# Patient Record
Sex: Female | Born: 1980 | Race: White | Hispanic: No | Marital: Married | Smoking: Current every day smoker
Health system: Southern US, Community
[De-identification: ages and names within clinical notes are randomized; demographics above are authoritative.]

## PROBLEM LIST (undated history)

## (undated) ENCOUNTER — Inpatient Hospital Stay (HOSPITAL_COMMUNITY): Payer: Self-pay

## (undated) DIAGNOSIS — F419 Anxiety disorder, unspecified: Secondary | ICD-10-CM

## (undated) DIAGNOSIS — Z87898 Personal history of other specified conditions: Secondary | ICD-10-CM

## (undated) DIAGNOSIS — F32A Depression, unspecified: Secondary | ICD-10-CM

## (undated) DIAGNOSIS — F329 Major depressive disorder, single episode, unspecified: Secondary | ICD-10-CM

## (undated) DIAGNOSIS — A609 Anogenital herpesviral infection, unspecified: Secondary | ICD-10-CM

## (undated) HISTORY — PX: SHOULDER SURGERY: SHX246

## (undated) HISTORY — DX: Personal history of other specified conditions: Z87.898

## (undated) HISTORY — DX: Anogenital herpesviral infection, unspecified: A60.9

---

## 2004-03-05 ENCOUNTER — Inpatient Hospital Stay (HOSPITAL_COMMUNITY): Admission: AD | Admit: 2004-03-05 | Discharge: 2004-03-07 | Payer: Self-pay | Admitting: Obstetrics and Gynecology

## 2004-08-08 ENCOUNTER — Other Ambulatory Visit: Admission: RE | Admit: 2004-08-08 | Discharge: 2004-08-08 | Payer: Self-pay | Admitting: Obstetrics and Gynecology

## 2005-09-05 ENCOUNTER — Other Ambulatory Visit: Admission: RE | Admit: 2005-09-05 | Discharge: 2005-09-05 | Payer: Self-pay | Admitting: Obstetrics and Gynecology

## 2008-07-07 ENCOUNTER — Ambulatory Visit (HOSPITAL_COMMUNITY): Admission: RE | Admit: 2008-07-07 | Discharge: 2008-07-07 | Payer: Self-pay | Admitting: Preventative Medicine

## 2013-05-31 ENCOUNTER — Encounter (HOSPITAL_COMMUNITY): Payer: Self-pay

## 2013-05-31 ENCOUNTER — Emergency Department (HOSPITAL_COMMUNITY): Payer: No Typology Code available for payment source

## 2013-05-31 ENCOUNTER — Emergency Department (HOSPITAL_COMMUNITY)
Admission: EM | Admit: 2013-05-31 | Discharge: 2013-05-31 | Disposition: A | Discharge status: Home / Self Care | Payer: No Typology Code available for payment source | Attending: Emergency Medicine | Admitting: Emergency Medicine

## 2013-05-31 DIAGNOSIS — Z8659 Personal history of other mental and behavioral disorders: Secondary | ICD-10-CM | POA: Insufficient documentation

## 2013-05-31 DIAGNOSIS — F17200 Nicotine dependence, unspecified, uncomplicated: Secondary | ICD-10-CM | POA: Insufficient documentation

## 2013-05-31 DIAGNOSIS — M545 Low back pain: Secondary | ICD-10-CM

## 2013-05-31 DIAGNOSIS — R51 Headache: Secondary | ICD-10-CM | POA: Insufficient documentation

## 2013-05-31 DIAGNOSIS — IMO0002 Reserved for concepts with insufficient information to code with codable children: Secondary | ICD-10-CM | POA: Insufficient documentation

## 2013-05-31 DIAGNOSIS — Y92410 Unspecified street and highway as the place of occurrence of the external cause: Secondary | ICD-10-CM | POA: Insufficient documentation

## 2013-05-31 DIAGNOSIS — Y9389 Activity, other specified: Secondary | ICD-10-CM | POA: Insufficient documentation

## 2013-05-31 HISTORY — DX: Major depressive disorder, single episode, unspecified: F32.9

## 2013-05-31 HISTORY — DX: Depression, unspecified: F32.A

## 2013-05-31 HISTORY — DX: Anxiety disorder, unspecified: F41.9

## 2013-05-31 MED ORDER — OXYCODONE-ACETAMINOPHEN 5-325 MG PO TABS
1.0000 | ORAL_TABLET | Freq: Once | ORAL | Status: AC
  Administered 2013-05-31: 1 via ORAL
  Filled 2013-05-31: qty 1

## 2013-05-31 MED ORDER — MORPHINE SULFATE 4 MG/ML IJ SOLN: 4.0000 mg | Freq: Once | INTRAMUSCULAR | Status: DC

## 2013-05-31 MED ORDER — DIAZEPAM 5 MG PO TABS
5.0000 mg | ORAL_TABLET | Freq: Once | ORAL | Status: AC
  Administered 2013-05-31: 5 mg via ORAL
  Filled 2013-05-31: qty 1

## 2013-05-31 MED ORDER — HYDROCODONE-ACETAMINOPHEN 5-325 MG PO TABS: 1.0000 | ORAL_TABLET | Freq: Four times a day (QID) | ORAL | Status: DC | PRN

## 2013-05-31 MED ORDER — NAPROXEN 500 MG PO TABS: 500.0000 mg | ORAL_TABLET | Freq: Two times a day (BID) | ORAL | Status: DC

## 2013-05-31 MED ORDER — DIAZEPAM 5 MG PO TABS: 5.0000 mg | ORAL_TABLET | Freq: Three times a day (TID) | ORAL | Status: DC | PRN
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
## 2013-06-06 ENCOUNTER — Emergency Department (HOSPITAL_COMMUNITY)
Admission: EM | Admit: 2013-06-06 | Discharge: 2013-06-06 | Disposition: A | Discharge status: Home / Self Care | Payer: No Typology Code available for payment source | Attending: Emergency Medicine | Admitting: Emergency Medicine

## 2013-06-06 ENCOUNTER — Emergency Department (HOSPITAL_COMMUNITY): Payer: No Typology Code available for payment source

## 2013-06-06 ENCOUNTER — Encounter (HOSPITAL_COMMUNITY): Payer: Self-pay | Admitting: Emergency Medicine

## 2013-06-06 DIAGNOSIS — Z79899 Other long term (current) drug therapy: Secondary | ICD-10-CM | POA: Insufficient documentation

## 2013-06-06 DIAGNOSIS — F3289 Other specified depressive episodes: Secondary | ICD-10-CM | POA: Insufficient documentation

## 2013-06-06 DIAGNOSIS — R51 Headache: Secondary | ICD-10-CM | POA: Insufficient documentation

## 2013-06-06 DIAGNOSIS — M542 Cervicalgia: Secondary | ICD-10-CM | POA: Insufficient documentation

## 2013-06-06 DIAGNOSIS — F329 Major depressive disorder, single episode, unspecified: Secondary | ICD-10-CM | POA: Insufficient documentation

## 2013-06-06 DIAGNOSIS — M549 Dorsalgia, unspecified: Secondary | ICD-10-CM | POA: Insufficient documentation

## 2013-06-06 DIAGNOSIS — R209 Unspecified disturbances of skin sensation: Secondary | ICD-10-CM | POA: Insufficient documentation

## 2013-06-06 DIAGNOSIS — F411 Generalized anxiety disorder: Secondary | ICD-10-CM | POA: Insufficient documentation

## 2013-06-06 DIAGNOSIS — F29 Unspecified psychosis not due to a substance or known physiological condition: Secondary | ICD-10-CM | POA: Insufficient documentation

## 2013-06-06 DIAGNOSIS — F17200 Nicotine dependence, unspecified, uncomplicated: Secondary | ICD-10-CM | POA: Insufficient documentation

## 2013-06-06 DIAGNOSIS — G8911 Acute pain due to trauma: Secondary | ICD-10-CM | POA: Insufficient documentation

## 2013-06-06 MED ORDER — DEXAMETHASONE SODIUM PHOSPHATE 10 MG/ML IJ SOLN
10.0000 mg | Freq: Once | INTRAMUSCULAR | Status: AC
  Administered 2013-06-06: 10 mg via INTRAMUSCULAR
  Filled 2013-06-06: qty 1

## 2013-06-06 MED ORDER — CYCLOBENZAPRINE HCL 5 MG PO TABS: 5.0000 mg | ORAL_TABLET | Freq: Three times a day (TID) | ORAL | Status: DC | PRN

## 2013-06-06 MED ORDER — TRAMADOL HCL 50 MG PO TABS: 50.0000 mg | ORAL_TABLET | Freq: Four times a day (QID) | ORAL | Status: DC | PRN

## 2013-06-06 MED ORDER — KETOROLAC TROMETHAMINE 30 MG/ML IJ SOLN
30.0000 mg | Freq: Once | INTRAMUSCULAR | Status: AC
  Administered 2013-06-06: 30 mg via INTRAMUSCULAR
  Filled 2013-06-06: qty 1
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
## 2013-06-06 NOTE — ED Notes (Signed)
While checking vital signs, patient tells me "I have been forgetting things a lot lately." RN Stacy aware.
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
## 2013-06-06 NOTE — Progress Notes (Signed)
[facility] provided pt with a [facility] Orange Card application and a list of primary care resources.  °

## 2013-06-06 NOTE — ED Provider Notes (Signed)
Medical screening examination/treatment/procedure(s) were performed by non-physician practitioner and as supervising physician I was immediately available for consultation/collaboration.    Shanna Cisco, MD 06/06/13 [phone]
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
## 2014-09-04 NOTE — L&D Delivery Note (Signed)
Delivery Note At 9:02 AM a viable and healthy female was delivered via Vaginal, Spontaneous Delivery (Presentation: Right Occiput ; Anterior  ).  APGAR: 9, 9; weight  Pending.   Placenta status: Intact, Spontaneous.  Cord: 3 vessels with the following complications: None.    Anesthesia: Epidural & lidocaine 1% Episiotomy: None Lacerations: Periurethral;1st degree Suture Repair: vicryl 4-0 Est. Blood Loss (mL):  150cc  Mom to postpartum.  Baby to Couplet care / Skin to Skin.  Karinne Schmader H. 04/01/2015, 9:32 AM

## 2014-09-09 LAB — OB RESULTS CONSOLE HIV ANTIBODY (ROUTINE TESTING): HIV: NONREACTIVE

## 2014-09-09 LAB — OB RESULTS CONSOLE RPR: RPR: NONREACTIVE

## 2014-09-09 LAB — OB RESULTS CONSOLE RUBELLA ANTIBODY, IGM: Rubella: IMMUNE

## 2014-09-09 LAB — OB RESULTS CONSOLE GC/CHLAMYDIA
Chlamydia: NEGATIVE
Gonorrhea: NEGATIVE

## 2014-09-09 LAB — OB RESULTS CONSOLE HEPATITIS B SURFACE ANTIGEN: HEP B S AG: NEGATIVE

## 2014-12-07 ENCOUNTER — Inpatient Hospital Stay (HOSPITAL_COMMUNITY)
Admission: AD | Admit: 2014-12-07 | Discharge: 2014-12-07 | Disposition: A | Discharge status: Home / Self Care | Payer: Medicaid Other | Source: Ambulatory Visit | Attending: Obstetrics | Admitting: Obstetrics

## 2014-12-07 ENCOUNTER — Encounter (HOSPITAL_COMMUNITY): Payer: Self-pay | Admitting: *Deleted

## 2014-12-07 DIAGNOSIS — O99332 Smoking (tobacco) complicating pregnancy, second trimester: Secondary | ICD-10-CM | POA: Insufficient documentation

## 2014-12-07 DIAGNOSIS — Z3A22 22 weeks gestation of pregnancy: Secondary | ICD-10-CM | POA: Insufficient documentation

## 2014-12-07 DIAGNOSIS — F17210 Nicotine dependence, cigarettes, uncomplicated: Secondary | ICD-10-CM | POA: Diagnosis not present

## 2014-12-07 DIAGNOSIS — R1030 Lower abdominal pain, unspecified: Secondary | ICD-10-CM | POA: Diagnosis present

## 2014-12-07 DIAGNOSIS — I959 Hypotension, unspecified: Secondary | ICD-10-CM | POA: Diagnosis not present

## 2014-12-07 DIAGNOSIS — N949 Unspecified condition associated with female genital organs and menstrual cycle: Secondary | ICD-10-CM | POA: Diagnosis not present

## 2014-12-07 DIAGNOSIS — R42 Dizziness and giddiness: Secondary | ICD-10-CM | POA: Insufficient documentation

## 2014-12-07 DIAGNOSIS — R102 Pelvic and perineal pain: Secondary | ICD-10-CM | POA: Diagnosis not present

## 2014-12-07 DIAGNOSIS — O9989 Other specified diseases and conditions complicating pregnancy, childbirth and the puerperium: Secondary | ICD-10-CM | POA: Insufficient documentation

## 2014-12-07 DIAGNOSIS — O2652 Maternal hypotension syndrome, second trimester: Secondary | ICD-10-CM

## 2014-12-07 LAB — URINALYSIS, ROUTINE W REFLEX MICROSCOPIC
BILIRUBIN URINE: NEGATIVE
Glucose, UA: NEGATIVE mg/dL
Ketones, ur: NEGATIVE mg/dL
NITRITE: NEGATIVE
PH: 6.5 (ref 5.0–8.0)
Protein, ur: NEGATIVE mg/dL
SPECIFIC GRAVITY, URINE: 1.01 (ref 1.005–1.030)
UROBILINOGEN UA: 0.2 mg/dL (ref 0.0–1.0)

## 2014-12-07 LAB — URINE MICROSCOPIC-ADD ON

## 2014-12-07 LAB — WET PREP, GENITAL
TRICH WET PREP: NONE SEEN
Yeast Wet Prep HPF POC: NONE SEEN

## 2014-12-07 NOTE — MAU Provider Note (Signed)
History     CSN: [id number]  Arrival date and time: 12/07/14 1234   First Provider Initiated Contact with Patient 12/07/14 1323      No chief complaint on file.  HPI  Jane Richardson is a 34 y.o. G2P1 at 5356w6d who presents to MAU today with 2 complaints.   Firstly, the patient endorses bilateral lower abdominal pain since last night that became worse with ambulation today while at work. She denies vaginal bleeding or LOF. She reports good fetal movement. She states pain is worse when sitting, standing or walking and improves with laying down. She denies N/V/D.   Secondly, she states that she has had 3 dizzy spells over the last 1-2 weeks. She is not currently dizzy. Dizziness occurs with standing and improves with sitting. She denies LOC.   OB History    Gravida Para Term Preterm AB TAB SAB Ectopic Multiple Living   2 1        1       Past Medical History  Diagnosis Date  . Depression   . Anxiety     Past Surgical History  Procedure Laterality Date  . Shoulder surgery      History reviewed. No pertinent family history.  History  Substance Use Topics  . Smoking status: Current Every Day Smoker -- 0.50 packs/day    Types: Cigarettes  . Smokeless tobacco: Never Used  . Alcohol Use: No    Allergies: No Known Allergies  No prescriptions prior to admission    Review of Systems  Constitutional: Negative for fever and malaise/fatigue.  Gastrointestinal: Positive for abdominal pain. Negative for nausea, vomiting, diarrhea and constipation.  Genitourinary: Negative for dysuria, urgency and frequency.       Neg - vaginal bleeding, discharge, LOF  Neurological: Positive for dizziness. Negative for loss of consciousness.   Physical Exam   Blood pressure 108/66, pulse 79, temperature 97.8 F (36.6 C), temperature source Oral, resp. rate 18, height 5\' 4"  (1.626 m), last menstrual period 06/18/2014.  Physical Exam  Constitutional: She is oriented to person, place,  and time. She appears well-developed and well-nourished. No distress.  HENT:  Head: Normocephalic.  Cardiovascular: Normal rate, regular rhythm and normal heart sounds.   Respiratory: Effort normal and breath sounds normal. No respiratory distress.  GI: Soft. She exhibits no distension and no mass. There is no tenderness. There is no rebound and no guarding.  Genitourinary: Uterus is enlarged (appropriate for GA). Uterus is not tender. Cervix exhibits no motion tenderness, no discharge and no friability. No bleeding in the vagina. Vaginal discharge (moderate amount of creamy, white discharge noted) found.  Cervix: closed, thick  Neurological: She is alert and oriented to person, place, and time.  Skin: Skin is warm and dry. No erythema.  Psychiatric: She has a normal mood and affect.   Results for orders placed or performed during the hospital encounter of 12/07/14 (from the past 24 hour(s))  Urinalysis, Routine w reflex microscopic     Status: Abnormal   Collection Time: 12/07/14 12:39 PM  Result Value Ref Range   Color, Urine YELLOW YELLOW   APPearance CLEAR CLEAR   Specific Gravity, Urine 1.010 1.005 - 1.030   pH 6.5 5.0 - 8.0   Glucose, UA NEGATIVE NEGATIVE mg/dL   Hgb urine dipstick LARGE (A) NEGATIVE   Bilirubin Urine NEGATIVE NEGATIVE   Ketones, ur NEGATIVE NEGATIVE mg/dL   Protein, ur NEGATIVE NEGATIVE mg/dL   Urobilinogen, UA 0.2 0.0 - 1.0 mg/dL  Nitrite NEGATIVE NEGATIVE   Leukocytes, UA SMALL (A) NEGATIVE  Urine microscopic-add on     Status: Abnormal   Collection Time: 12/07/14 12:39 PM  Result Value Ref Range   Squamous Epithelial / LPF FEW (A) RARE   WBC, UA 3-6 <3 WBC/hpf   RBC / HPF 0-2 <3 RBC/hpf   Bacteria, UA FEW (A) RARE  Wet prep, genital     Status: Abnormal   Collection Time: 12/07/14  1:30 PM  Result Value Ref Range   Yeast Wet Prep HPF POC NONE SEEN NONE SEEN   Trich, Wet Prep NONE SEEN NONE SEEN   Clue Cells Wet Prep HPF POC FEW (A) NONE SEEN   WBC,  Wet Prep HPF POC MODERATE (A) NONE SEEN     MAU Course  Procedures None  MDM UA and Wet prep today Urine culture sent Discussed patient with Dr. Chestine Spore. Recommends orthostatic vital signs performed today Orthostatic vital signs show no significant change in BP or HR Discussed option to treat BV with patient as recommended by Dr. Chestine Spore. Patient denies symptoms and prefers no treatment at this time Assessment and Plan  A: SIUP at [redacted]w[redacted]d Round ligament pain Hypotension in pregnancy, second trimester  P: Discharge home Advised Tylenol, abdominal binder, moderation of activity and warm bath/shower for pain Advised to increased PO hydration as tolerated Encouraged to follow-up in the office as scheduled or sooner PRN Patient may return to MAU as needed or if her condition were to change or worsen   Marny Lowenstein, PA-C  12/07/2014, 2:35 PM

## 2014-12-07 NOTE — Discharge Instructions (Signed)
Hypotension As your heart beats, it forces blood through your body. This force is called blood pressure. If you have hypotension, you have low blood pressure. When your blood pressure is too low, you may not get enough blood to your brain. You may feel weak, feel lightheaded, have a fast heartbeat, or even pass out (faint). HOME CARE  Drink enough fluids to keep your pee (urine) clear or pale yellow.  Take all medicines as told by your doctor.  Get up slowly after sitting or lying down.  Wear support stockings as told by your doctor.  Maintain a healthy diet by including foods such as fruits, vegetables, nuts, whole grains, and lean meats. GET HELP IF:  You are throwing up (vomiting) or have watery poop (diarrhea).  You have a fever for more than 2-3 days.  You feel more thirsty than usual.  You feel weak and tired. GET HELP RIGHT AWAY IF:   You pass out (faint).  You have chest pain or a fast or irregular heartbeat.  You lose feeling in part of your body.  You cannot move your arms or legs.  You have trouble speaking.  You get sweaty or feel lightheaded. MAKE SURE YOU:   Understand these instructions.  Will watch your condition.  Will get help right away if you are not doing well or get worse. Document Released: 11/15/2009 Document Revised: 04/23/2013 Document Reviewed: 02/21/2013 [facility] Patient Information 2015 [place], [place]. This information is not intended to replace advice given to you by your health care provider. Make sure you discuss any questions you have with your health care provider.  Abdominal Pain During Pregnancy Belly (abdominal) pain is common during pregnancy. Most of the time, it is not a serious problem. Other times, it can be a sign that something is wrong with the pregnancy. Always tell your doctor if you have belly pain. HOME CARE Monitor your belly pain for any changes. The following actions may help you feel better:  Do not have sex  (intercourse) or put anything in your vagina until you feel better.  Rest until your pain stops.  Drink clear fluids if you feel sick to your stomach (nauseous). Do not eat solid food until you feel better.  Only take medicine as told by your doctor.  Keep all doctor visits as told. GET HELP RIGHT AWAY IF:   You are bleeding, leaking fluid, or pieces of tissue come out of your vagina.  You have more pain or cramping.  You keep throwing up (vomiting).  You have pain when you pee (urinate) or have blood in your pee.  You have a fever.  You do not feel your baby moving as much.  You feel very weak or feel like passing out.  You have trouble breathing, with or without belly pain.  You have a very bad headache and belly pain.  You have fluid leaking from your vagina and belly pain.  You keep having watery poop (diarrhea).  Your belly pain does not go away after resting, or the pain gets worse. MAKE SURE YOU:   Understand these instructions.  Will watch your condition.  Will get help right away if you are not doing well or get worse. Document Released: 08/09/2009 Document Revised: 04/23/2013 Document Reviewed: 03/20/2013 [facility] Patient Information 2015 [place], [place]. This information is not intended to replace advice given to you by your health care provider. Make sure you discuss any questions you have with your health care provider.

## 2014-12-07 NOTE — MAU Note (Signed)
Pelvis pain in lower abd starting today. Fainting spells for one week. Denies vaginal bleeding or discharge. Sates that MD from office wants and EKG.

## 2014-12-08 LAB — CULTURE, OB URINE

## 2015-03-09 LAB — OB RESULTS CONSOLE GBS: STREP GROUP B AG: NEGATIVE

## 2015-03-31 ENCOUNTER — Inpatient Hospital Stay (HOSPITAL_COMMUNITY)
Admission: AD | Admit: 2015-03-31 | Discharge: 2015-04-02 | DRG: 775 | Disposition: A | Discharge status: Home / Self Care | Payer: Medicaid Other | Source: Ambulatory Visit | Attending: Obstetrics and Gynecology | Admitting: Obstetrics and Gynecology

## 2015-03-31 DIAGNOSIS — Z3A39 39 weeks gestation of pregnancy: Secondary | ICD-10-CM | POA: Diagnosis present

## 2015-03-31 DIAGNOSIS — K219 Gastro-esophageal reflux disease without esophagitis: Secondary | ICD-10-CM | POA: Diagnosis present

## 2015-03-31 DIAGNOSIS — O99334 Smoking (tobacco) complicating childbirth: Secondary | ICD-10-CM | POA: Diagnosis present

## 2015-03-31 DIAGNOSIS — F17210 Nicotine dependence, cigarettes, uncomplicated: Secondary | ICD-10-CM | POA: Diagnosis present

## 2015-03-31 DIAGNOSIS — O99824 Streptococcus B carrier state complicating childbirth: Secondary | ICD-10-CM | POA: Diagnosis present

## 2015-03-31 DIAGNOSIS — O4200 Premature rupture of membranes, onset of labor within 24 hours of rupture, unspecified weeks of gestation: Secondary | ICD-10-CM | POA: Diagnosis present

## 2015-03-31 DIAGNOSIS — O9962 Diseases of the digestive system complicating childbirth: Secondary | ICD-10-CM | POA: Diagnosis present

## 2015-04-01 ENCOUNTER — Encounter (HOSPITAL_COMMUNITY): Payer: Self-pay | Admitting: *Deleted

## 2015-04-01 ENCOUNTER — Inpatient Hospital Stay (HOSPITAL_COMMUNITY): Payer: Medicaid Other | Admitting: Anesthesiology

## 2015-04-01 DIAGNOSIS — K219 Gastro-esophageal reflux disease without esophagitis: Secondary | ICD-10-CM | POA: Diagnosis present

## 2015-04-01 DIAGNOSIS — O99334 Smoking (tobacco) complicating childbirth: Secondary | ICD-10-CM | POA: Diagnosis present

## 2015-04-01 DIAGNOSIS — O99824 Streptococcus B carrier state complicating childbirth: Secondary | ICD-10-CM | POA: Diagnosis present

## 2015-04-01 DIAGNOSIS — Z3A39 39 weeks gestation of pregnancy: Secondary | ICD-10-CM | POA: Diagnosis present

## 2015-04-01 DIAGNOSIS — F17210 Nicotine dependence, cigarettes, uncomplicated: Secondary | ICD-10-CM | POA: Diagnosis present

## 2015-04-01 DIAGNOSIS — O4200 Premature rupture of membranes, onset of labor within 24 hours of rupture, unspecified weeks of gestation: Secondary | ICD-10-CM | POA: Diagnosis present

## 2015-04-01 DIAGNOSIS — O9962 Diseases of the digestive system complicating childbirth: Secondary | ICD-10-CM | POA: Diagnosis present

## 2015-04-01 LAB — CBC
HCT: 34.2 % — ABNORMAL LOW (ref 36.0–46.0)
Hemoglobin: 11.4 g/dL — ABNORMAL LOW (ref 12.0–15.0)
MCH: 29.5 pg (ref 26.0–34.0)
MCHC: 33.3 g/dL (ref 30.0–36.0)
MCV: 88.6 fL (ref 78.0–100.0)
Platelets: 346 10*3/uL (ref 150–400)
RBC: 3.86 MIL/uL — AB (ref 3.87–5.11)
RDW: 13.9 % (ref 11.5–15.5)
WBC: 16.4 10*3/uL — AB (ref 4.0–10.5)

## 2015-04-01 LAB — ABO/RH: ABO/RH(D): O POS

## 2015-04-01 LAB — TYPE AND SCREEN
ABO/RH(D): O POS
ANTIBODY SCREEN: NEGATIVE

## 2015-04-01 MED ORDER — ONDANSETRON HCL 4 MG PO TABS: 4.0000 mg | ORAL_TABLET | ORAL | Status: DC | PRN

## 2015-04-01 MED ORDER — FENTANYL 2.5 MCG/ML BUPIVACAINE 1/10 % EPIDURAL INFUSION (WH - ANES): 14.0000 mL/h | INTRAMUSCULAR | Status: DC | PRN

## 2015-04-01 MED ORDER — OXYCODONE-ACETAMINOPHEN 5-325 MG PO TABS: 1.0000 | ORAL_TABLET | ORAL | Status: DC | PRN

## 2015-04-01 MED ORDER — DIBUCAINE 1 % RE OINT
1.0000 "application " | TOPICAL_OINTMENT | RECTAL | Status: DC | PRN
  Filled 2015-04-01: qty 28

## 2015-04-01 MED ORDER — ONDANSETRON HCL 4 MG/2ML IJ SOLN: 4.0000 mg | Freq: Four times a day (QID) | INTRAMUSCULAR | Status: DC | PRN

## 2015-04-01 MED ORDER — BUTORPHANOL TARTRATE 1 MG/ML IJ SOLN
1.0000 mg | INTRAMUSCULAR | Status: DC | PRN
  Administered 2015-04-01: 1 mg via INTRAVENOUS
  Filled 2015-04-01: qty 1

## 2015-04-01 MED ORDER — IBUPROFEN 600 MG PO TABS
600.0000 mg | ORAL_TABLET | Freq: Four times a day (QID) | ORAL | Status: DC
  Administered 2015-04-01 – 2015-04-02 (×5): 600 mg via ORAL
  Filled 2015-04-01 (×5): qty 1

## 2015-04-01 MED ORDER — ACETAMINOPHEN 325 MG PO TABS: 650.0000 mg | ORAL_TABLET | ORAL | Status: DC | PRN

## 2015-04-01 MED ORDER — OXYTOCIN 40 UNITS IN LACTATED RINGERS INFUSION - SIMPLE MED
1.0000 m[IU]/min | INTRAVENOUS | Status: DC
  Administered 2015-04-01: 2 m[IU]/min via INTRAVENOUS
  Filled 2015-04-01: qty 1000

## 2015-04-01 MED ORDER — DIPHENHYDRAMINE HCL 50 MG/ML IJ SOLN: 12.5000 mg | INTRAMUSCULAR | Status: DC | PRN

## 2015-04-01 MED ORDER — PHENYLEPHRINE 40 MCG/ML (10ML) SYRINGE FOR IV PUSH (FOR BLOOD PRESSURE SUPPORT)
80.0000 ug | PREFILLED_SYRINGE | INTRAVENOUS | Status: DC | PRN
  Filled 2015-04-01: qty 2

## 2015-04-01 MED ORDER — PRENATAL MULTIVITAMIN CH
1.0000 | ORAL_TABLET | Freq: Every day | ORAL | Status: DC
  Administered 2015-04-01 – 2015-04-02 (×2): 1 via ORAL
  Filled 2015-04-01 (×2): qty 1

## 2015-04-01 MED ORDER — CITRIC ACID-SODIUM CITRATE 334-500 MG/5ML PO SOLN
30.0000 mL | ORAL | Status: DC | PRN
Start: 2015-04-01 — End: 2015-04-01

## 2015-04-01 MED ORDER — TERBUTALINE SULFATE 1 MG/ML IJ SOLN: 0.2500 mg | Freq: Once | INTRAMUSCULAR | Status: DC | PRN

## 2015-04-01 MED ORDER — BENZOCAINE-MENTHOL 20-0.5 % EX AERO
1.0000 "application " | INHALATION_SPRAY | CUTANEOUS | Status: DC | PRN
  Administered 2015-04-02: 1 via TOPICAL
  Filled 2015-04-01 (×2): qty 56

## 2015-04-01 MED ORDER — OXYTOCIN 40 UNITS IN LACTATED RINGERS INFUSION - SIMPLE MED: 62.5000 mL/h | INTRAVENOUS | Status: DC

## 2015-04-01 MED ORDER — LIDOCAINE HCL (PF) 1 % IJ SOLN
INTRAMUSCULAR | Status: DC | PRN
  Administered 2015-04-01 (×2): 4 mL

## 2015-04-01 MED ORDER — OXYCODONE-ACETAMINOPHEN 5-325 MG PO TABS: 2.0000 | ORAL_TABLET | ORAL | Status: DC | PRN

## 2015-04-01 MED ORDER — PHENYLEPHRINE 40 MCG/ML (10ML) SYRINGE FOR IV PUSH (FOR BLOOD PRESSURE SUPPORT): 80.0000 ug | PREFILLED_SYRINGE | INTRAVENOUS | Status: DC | PRN

## 2015-04-01 MED ORDER — SIMETHICONE 80 MG PO CHEW: 80.0000 mg | CHEWABLE_TABLET | ORAL | Status: DC | PRN

## 2015-04-01 MED ORDER — LANOLIN HYDROUS EX OINT: TOPICAL_OINTMENT | CUTANEOUS | Status: DC | PRN

## 2015-04-01 MED ORDER — METHYLERGONOVINE MALEATE 0.2 MG/ML IJ SOLN: 0.2000 mg | INTRAMUSCULAR | Status: DC | PRN

## 2015-04-01 MED ORDER — PROMETHAZINE HCL 25 MG/ML IJ SOLN
25.0000 mg | Freq: Four times a day (QID) | INTRAMUSCULAR | Status: DC | PRN
  Administered 2015-04-01: 25 mg via INTRAVENOUS
  Filled 2015-04-01: qty 1

## 2015-04-01 MED ORDER — ZOLPIDEM TARTRATE 5 MG PO TABS: 5.0000 mg | ORAL_TABLET | Freq: Every evening | ORAL | Status: DC | PRN

## 2015-04-01 MED ORDER — EPHEDRINE 5 MG/ML INJ
10.0000 mg | INTRAVENOUS | Status: DC | PRN
  Filled 2015-04-01: qty 2

## 2015-04-01 MED ORDER — LIDOCAINE HCL (PF) 1 % IJ SOLN
30.0000 mL | INTRAMUSCULAR | Status: DC | PRN
  Filled 2015-04-01: qty 30

## 2015-04-01 MED ORDER — LACTATED RINGERS IV SOLN
INTRAVENOUS | Status: DC
  Administered 2015-04-01: 02:00:00 via INTRAVENOUS

## 2015-04-01 MED ORDER — WITCH HAZEL-GLYCERIN EX PADS: 1.0000 "application " | MEDICATED_PAD | CUTANEOUS | Status: DC | PRN

## 2015-04-01 MED ORDER — FENTANYL 2.5 MCG/ML BUPIVACAINE 1/10 % EPIDURAL INFUSION (WH - ANES)
14.0000 mL/h | INTRAMUSCULAR | Status: DC | PRN
  Administered 2015-04-01 (×2): 14 mL/h via EPIDURAL
  Filled 2015-04-01: qty 125

## 2015-04-01 MED ORDER — PHENYLEPHRINE 40 MCG/ML (10ML) SYRINGE FOR IV PUSH (FOR BLOOD PRESSURE SUPPORT)
PREFILLED_SYRINGE | INTRAVENOUS | Status: AC
  Filled 2015-04-01: qty 20

## 2015-04-01 MED ORDER — METHYLERGONOVINE MALEATE 0.2 MG PO TABS: 0.2000 mg | ORAL_TABLET | ORAL | Status: DC | PRN

## 2015-04-01 MED ORDER — OXYTOCIN BOLUS FROM INFUSION: 500.0000 mL | INTRAVENOUS | Status: DC

## 2015-04-01 MED ORDER — ONDANSETRON HCL 4 MG/2ML IJ SOLN: 4.0000 mg | INTRAMUSCULAR | Status: DC | PRN

## 2015-04-01 MED ORDER — TETANUS-DIPHTH-ACELL PERTUSSIS 5-2.5-18.5 LF-MCG/0.5 IM SUSP
0.5000 mL | Freq: Once | INTRAMUSCULAR | Status: AC
  Administered 2015-04-02: 0.5 mL via INTRAMUSCULAR
  Filled 2015-04-01 (×2): qty 0.5

## 2015-04-01 MED ORDER — LACTATED RINGERS IV SOLN: 500.0000 mL | INTRAVENOUS | Status: DC | PRN

## 2015-04-01 MED ORDER — SENNOSIDES-DOCUSATE SODIUM 8.6-50 MG PO TABS
2.0000 | ORAL_TABLET | ORAL | Status: DC
  Administered 2015-04-01: 2 via ORAL
  Filled 2015-04-01: qty 2

## 2015-04-01 MED ORDER — DIPHENHYDRAMINE HCL 25 MG PO CAPS: 25.0000 mg | ORAL_CAPSULE | Freq: Four times a day (QID) | ORAL | Status: DC | PRN

## 2015-04-01 NOTE — Anesthesia Preprocedure Evaluation (Signed)
Anesthesia Evaluation  Patient identified by MRN, date of birth, ID band Patient awake    Reviewed: Allergy & Precautions, Patient's Chart, lab work & pertinent test results  Airway Mallampati: II  TM Distance: >3 FB Neck ROM: Full    Dental no notable dental hx. (+) Teeth Intact   Pulmonary Current Smoker,  breath sounds clear to auscultation  Pulmonary exam normal       Cardiovascular negative cardio ROS Normal cardiovascular examRhythm:Regular Rate:Normal     Neuro/Psych PSYCHIATRIC DISORDERS Anxiety Depression negative neurological ROS     GI/Hepatic Neg liver ROS, GERD-  ,  Endo/Other  negative endocrine ROS  Renal/GU negative Renal ROS  negative genitourinary   Musculoskeletal negative musculoskeletal ROS (+)   Abdominal   Peds  Hematology  (+) anemia ,   Anesthesia Other Findings   Reproductive/Obstetrics (+) Pregnancy                             Anesthesia Physical Anesthesia Plan  ASA: II  Anesthesia Plan: Epidural   Post-op Pain Management:    Induction:   Airway Management Planned: Natural Airway  Additional Equipment:   Intra-op Plan:   Post-operative Plan:   Informed Consent: I have reviewed the patients History and Physical, chart, labs and discussed the procedure including the risks, benefits and alternatives for the proposed anesthesia with the patient or authorized representative who has indicated his/her understanding and acceptance.     Plan Discussed with: Anesthesiologist  Anesthesia Plan Comments:         Anesthesia Quick Evaluation

## 2015-04-01 NOTE — Lactation Note (Signed)
This note was copied from the chart of Jane Richardson. Lactation Consultation Note  Patient Name: Jane Richardson [id number] Date: 04/01/2015 Reason for consult: Initial assessment   Initial consult at 6 hrs old; GA 39.2; BW 5 lbs, 13 oz.  Mom is a P2 with 2 weeks breastfeeding experience with older 34 yo child.  Mom states her "milk dried up." Infant has breastfed x1 (10 min) + attempts x2 (6 min) + formula x1 (25 ml) since birth 6 hours ago; voids-0; stools-2 since birth. Mom in bed and older child (55 yrs old) holding infant in chair when [facility] entered room; infant was showing cues with older sibling holding her.   Mom stated she knew how to hand express; mom reluctantly showed LC, but as mom began to pull breast out of bra she started dripping with colostrum; with only one compression milk was pouring down her fingers.  LC praised mom on the amount of milk she had and encouraged hand expressing or hand pumping the milk and giving to baby.   Educated on feeding cues, cluster feeding, and size of infant's stomach.  Educated on supply & demand and risks of formula feeding r/t breastfeeding. Mom maintained a closed posture throughout consult and was not receptive to teaching.   Hand pump given and demonstrated use for EBM feedings.  Spoons, curved-tip syringe, and colostrum collection containers given for hand expressing breastmilk for EBM feedings. Demonstrated how to spoon feed.   LC encouraged mom to latch infant since infant was showing cues to feed but mom gave reasons why she could not latch at the moment. Lactation brochure given and informed of hospital support group and outpatient services.  Encouraged mom to call for assistance as needed. Report given to RN of consult.     Maternal Data Has patient been taught Hand Expression?: Yes Does the patient have breastfeeding experience prior to this delivery?: Yes  Feeding Feeding Type: Breast Fed Nipple Type: Slow - flow Length of  feed: 10 min  LATCH Score/Interventions                      Lactation Tools Discussed/Used Pump Review: Setup, frequency, and cleaning;Milk Storage Initiated by:: Burna Sis, RN, IBCLC Date initiated:: 04/01/15   Consult Status Consult Status: PRN    Lendon Ka 04/01/2015, 4:17 PM

## 2015-04-01 NOTE — OB Triage Provider Note (Signed)
S: Jane Richardson is a 34 y.o. G2P1002 at [redacted]w[redacted]d who presents today with leaking of fluid. She denies any VB. She confirms fetal movement. Asked by attending to perform speculum exam to look for any HSV lesion. Patient denies any sx (burning, tingling or itching). O: VSS, afebrile Abdomen: soft, non-tender, gravid External: no lesion Vagina: none seen, but obscured with large amount of pooling of fluid. Cervix: pink, smooth Uterus: AGA FHT: 130, moderate with 15x15 accels, no decels Toco: irregular contractions  Results for orders placed or performed during the hospital encounter of 03/31/15 (from the past 24 hour(s))  CBC     Status: Abnormal   Collection Time: 04/01/15  2:05 AM  Result Value Ref Range   WBC 16.4 (H) 4.0 - 10.5 K/uL   RBC 3.86 (L) 3.87 - 5.11 MIL/uL   Hemoglobin 11.4 (L) 12.0 - 15.0 g/dL   HCT 16.1 (L) 09.6 - 04.5 %   MCV 88.6 78.0 - 100.0 fL   MCH 29.5 26.0 - 34.0 pg   MCHC 33.3 30.0 - 36.0 g/dL   RDW 40.9 81.1 - 91.4 %   Platelets 346 150 - 400 K/uL   A/P: Exam for ROM RN will report to attending MD

## 2015-04-01 NOTE — MAU Note (Signed)
Pt reports ? Leaking fluid since 2100, some pressure but denies contractions.

## 2015-04-01 NOTE — Anesthesia Procedure Notes (Signed)
Epidural Patient location during procedure: OB Start time: 04/01/2015 8:57 AM  Staffing Anesthesiologist: Mal Amabile Performed by: anesthesiologist   Preanesthetic Checklist Completed: patient identified, site marked, surgical consent, pre-op evaluation, timeout performed, IV checked, risks and benefits discussed and monitors and equipment checked  Epidural Patient position: sitting Prep: site prepped and draped and DuraPrep Patient monitoring: continuous pulse ox and blood pressure Approach: midline Location: L3-L4 Injection technique: LOR air  Needle:  Needle type: Tuohy  Needle gauge: 17 G Needle length: 9 cm and 9 Needle insertion depth: 4 cm Catheter type: closed end flexible Catheter size: 19 Gauge Catheter at skin depth: 9 cm Test dose: negative and Other  Assessment Events: blood not aspirated, injection not painful, no injection resistance, negative IV test and no paresthesia  Additional Notes Patient identified. Risks and benefits discussed including failed block, incomplete  Pain control, post dural puncture headache, nerve damage, paralysis, blood pressure Changes, nausea, vomiting, reactions to medications-both toxic and allergic and post Partum back pain. All questions were answered. Patient expressed understanding and wished to proceed. Sterile technique was used throughout procedure. Epidural site was Dressed with sterile barrier dressing. No paresthesias, signs of intravascular injection Or signs of intrathecal spread were encountered.  Patient was more comfortable after the epidural was dosed. Please see RN's note for documentation of vital signs and FHR which are stable.

## 2015-04-01 NOTE — H&P (Signed)
34 y.o. G2P1 @ [redacted]w[redacted]d presents with LOF (clear) since 2100.  Contractions mild, not painful, irregular.  Otherwise has good fetal movement and no bleeding.  On valtrex, no prodromal symptoms or recent outbreak.  Past Medical History  Diagnosis Date  . Depression   . Anxiety     Past Surgical History  Procedure Laterality Date  . Shoulder surgery      OB History  Gravida Para Term Preterm AB SAB TAB Ectopic Multiple Living  # Outcome Date GA Lbr Len/2nd Weight Sex Delivery Anes PTL Lv  2 Current           1 Term 2005 [redacted]w[redacted]d  3.572 kg (7 lb 14 oz) F Vag-Spont   Y      History   Social History  . Marital Status: Married    Spouse Name: N/A  . Number of Children: N/A  . Years of Education: N/A   Occupational History  . Not on file.   Social History Main Topics  . Smoking status: Current Every Day Smoker -- 0.50 packs/day    Types: Cigarettes  . Smokeless tobacco: Never Used  . Alcohol Use: No  . Drug Use: No  . Sexual Activity: Yes    Birth Control/ Protection: None   Other Topics Concern  . Not on file   Social History Narrative   Review of patient's allergies indicates no known allergies.    Prenatal Transfer Tool  Maternal Diabetes: No Genetic Screening: Normal Maternal Ultrasounds/Referrals: Normal Fetal Ultrasounds or other Referrals:  None Maternal Substance Abuse:  No Significant Maternal Medications:  Meds include: Other: Wellbutrin Significant Maternal Lab Results: Lab values include: Group B Strep negative  ABO, Rh:  O Positive Rubella:  Immune  Other PNC: uncomplicated.    Filed Vitals:   04/01/15 0023  BP: 110/72  Pulse: 81  Temp:   Resp: 16     General:  NAD Abdomen:  soft, gravid SSE: grossly ruptured w positive pooling, nirazine per RN.  No evidence of herpetic lesion on vulva/vagina per NP report SVE:  3 cm yesterday, pending today per RN FHTs:  130s, mod var, + accels, no decels Toco:  q3-5 min   A/P   34 y.o.  G2P1 [redacted]w[redacted]d presents with ROM Admit to L&D Pitocin augmentation.   Epidural prn FSR/ vtx/ GBS positive  Jane Richardson GEFFEL Jane Richardson

## 2015-04-02 LAB — CBC
HCT: 30.2 % — ABNORMAL LOW (ref 36.0–46.0)
Hemoglobin: 9.8 g/dL — ABNORMAL LOW (ref 12.0–15.0)
MCH: 29.1 pg (ref 26.0–34.0)
MCHC: 32.5 g/dL (ref 30.0–36.0)
MCV: 89.6 fL (ref 78.0–100.0)
Platelets: 298 10*3/uL (ref 150–400)
RBC: 3.37 MIL/uL — ABNORMAL LOW (ref 3.87–5.11)
RDW: 14.1 % (ref 11.5–15.5)
WBC: 13.5 10*3/uL — ABNORMAL HIGH (ref 4.0–10.5)

## 2015-04-02 LAB — RPR: RPR Ser Ql: NONREACTIVE

## 2015-04-02 NOTE — Discharge Summary (Signed)
Obstetric Discharge Summary Reason for Admission: rupture of membranes Prenatal Procedures: ultrasound Intrapartum Procedures: spontaneous vaginal delivery Postpartum Procedures: none Complications-Operative and Postpartum: 1st degree perineal laceration HEMOGLOBIN  Date Value Ref Range Status  04/02/2015 9.8* 12.0 - 15.0 g/dL Final   HCT  Date Value Ref Range Status  04/02/2015 30.2* 36.0 - 46.0 % Final    Physical Exam:  General: alert Lochia: appropriate Uterine Fundus: firm   Discharge Diagnoses: Term Pregnancy-delivered  Discharge Information: Date: 04/02/2015 Activity: pelvic rest Diet: routine Medications: PNV and Ibuprofen Condition: stable Instructions: refer to practice specific booklet Discharge to: home   Newborn Data: Live born female  Birth Weight: 5 lb 13 oz (2637 g) APGAR: 9, 9  Home with mother.  Jane Richardson E 04/02/2015, 8:03 AM

## 2015-04-02 NOTE — Progress Notes (Signed)
CSW briefly met with MOB and FOB due to MOB presenting with a history of depression, anxiety, and postpartum depression.   Visit was brief since MOB confirmed that she is currently prescribed Wellbutrin as listed on her prenatal records, and MOB was difficult to engage as she was focused on caring for the infant.  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 MOB shared that she did not restart medications due to symptoms, but rather in an act of prevention. FOB confirmed having a strong support system from family and friends that can assist support their transition to postpartum.    MOB agreed to contact her medical provider if she notes that Wellbutrin does not sufficient control symptoms.   No barriers to discharge.

## 2015-04-02 NOTE — Anesthesia Postprocedure Evaluation (Signed)
Anesthesia Post Note  Patient: Jane Richardson  Procedure(s) Performed: * No procedures listed *  Anesthesia type: Epidural  Patient location: Mother/Baby  Post pain: Pain level controlled  Post assessment: Post-op Vital signs reviewed  Last Vitals:  Filed Vitals:   04/02/15 0612  BP: 108/65  Pulse: 71  Temp:   Resp:     Post vital signs: Reviewed  Level of consciousness: awake  Complications: No apparent anesthesia complications, patient states she received epidural and delivered immediately after.

## 2015-04-02 NOTE — Progress Notes (Signed)
PPD#1 Pt is doing well. She has mild lochia, She would like to go home VSSAF IMP/ Stable Plan/ Will discharge.

## 2015-04-03 ENCOUNTER — Inpatient Hospital Stay (HOSPITAL_COMMUNITY): Payer: No Typology Code available for payment source

## 2015-11-11 ENCOUNTER — Other Ambulatory Visit: Payer: Self-pay | Admitting: Pulmonary Disease

## 2015-11-11 ENCOUNTER — Other Ambulatory Visit: Payer: Self-pay | Admitting: Oral Surgery

## 2015-11-11 DIAGNOSIS — M2740 Unspecified cyst of jaw: Secondary | ICD-10-CM

## 2015-11-12 ENCOUNTER — Ambulatory Visit
Admission: RE | Admit: 2015-11-12 | Discharge: 2015-11-12 | Disposition: A | Discharge status: Home / Self Care | Payer: Medicaid Other | Source: Ambulatory Visit | Attending: Oral Surgery | Admitting: Oral Surgery

## 2015-11-12 DIAGNOSIS — M2740 Unspecified cyst of jaw: Secondary | ICD-10-CM

## 2016-09-04 NOTE — L&D Delivery Note (Signed)
Delivery Note I was called at 10:47 and notified that the patient was 8 cm and feeling pressure.  Upon my arrival at 10:52, the baby had precipitously delivered.   I delivered the placenta upon my arrival.   At 10:51 AM a viable child was delivered via Vaginal, Spontaneous (Presentation: Cephalic).  APGAR: 9, 9; weight 6 lb 15.8 oz (3170 g).   Placenta status: spontaneous , in tact .  Cord:  3V with the following complications: None .  Cord pH: n/a  There was a hemostatic first degree laceration and a right labial laceration immediately adjacent to the clitoris.  I attempted to inject the labia with 1% lidocaine to repair the laceration, but the patient was unable to tolerate the injection.  With pressure, the laceration was hemostatic and the patient declined repair for cosemetic purposes.   Anesthesia:  1% Lidocaine Episiotomy: None Lacerations: Labial;1st degree Suture Repair: n/a Est. Blood Loss (mL):  300 mL  Mom to postpartum.  Baby to Couplet care / Skin to Skin.  Shellby Schlink GEFFEL Samyak Sackmann 07/25/2017, 5:09 PM

## 2016-12-20 IMAGING — CT CT MAXILLOFACIAL W/O CM
1 series · 15 of 30 positions shown, 19 images · non-contrast
Comparison: None.

CLINICAL DATA: Intermittent pain for 1 year. Query bony lesion in
the RIGHT mandible.

EXAM:
CT MAXILLOFACIAL WITHOUT CONTRAST
TECHNIQUE: Multidetector CT imaging of the maxillofacial structures was
performed. Multiplanar CT image reconstructions were also generated.
A small metallic BB was placed on the right temple in order to
reliably differentiate right from left.

[Series 4: maxofacial soft · axial · 0.31mm/px · z∈[+885,+1038]mm · 15 of 55 slices shown, 19 images]
[im 2/55  brain]
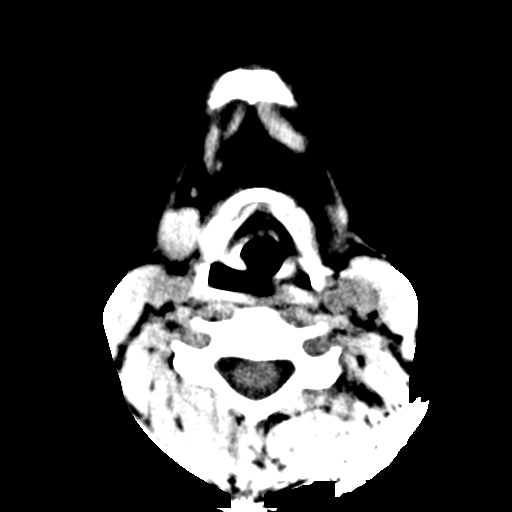
[im 2/55  bone]
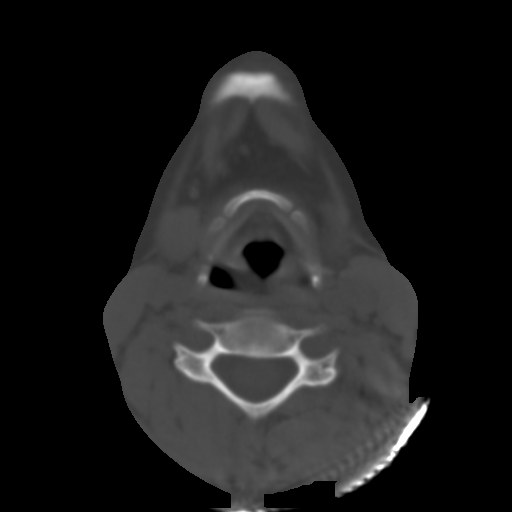
[im 6/55  bone]
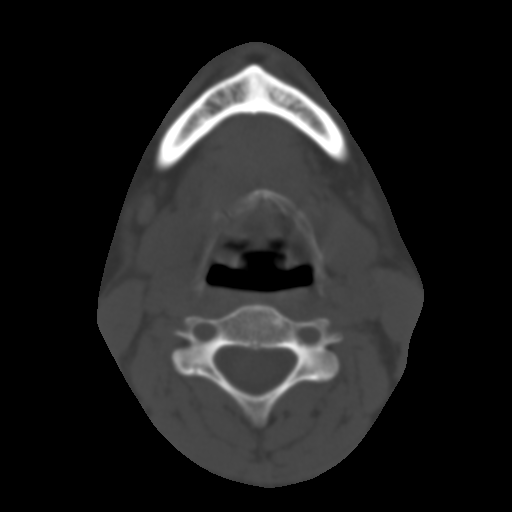
[im 10/55  bone]
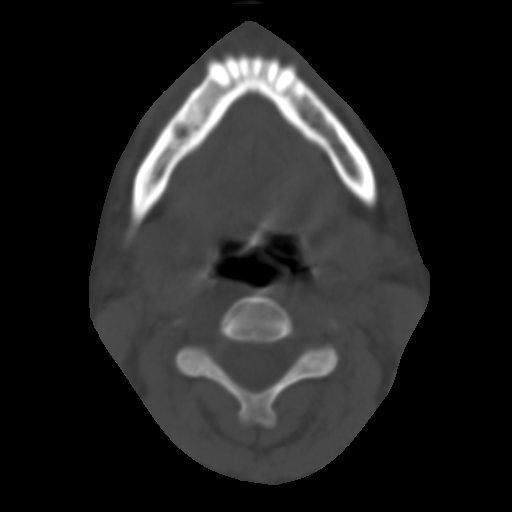
[im 14/55  bone]
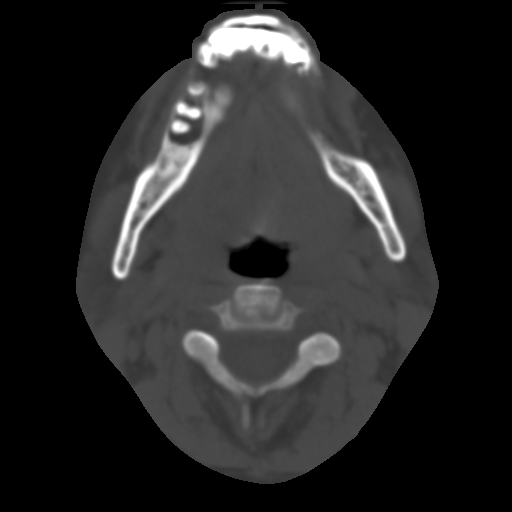
[im 17/55  brain]
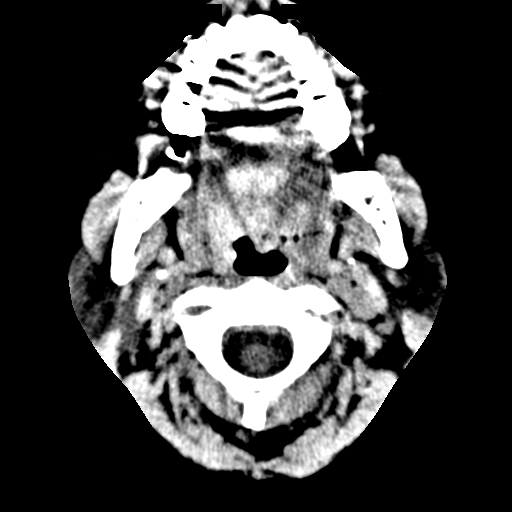
[im 17/55  bone]
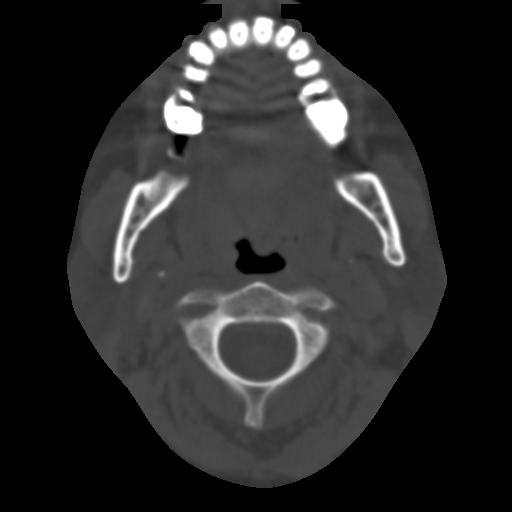
[im 21/55  bone]
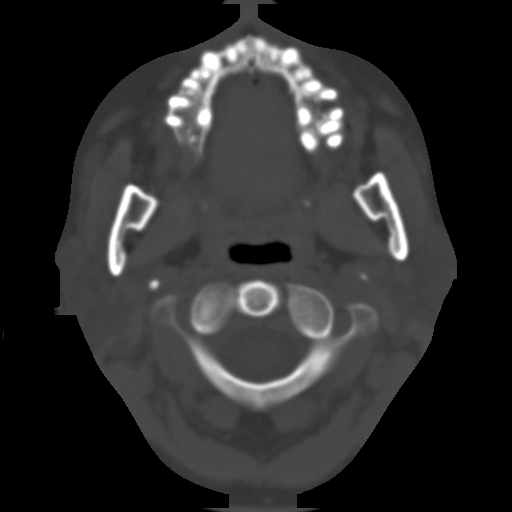
[im 25/55  bone]
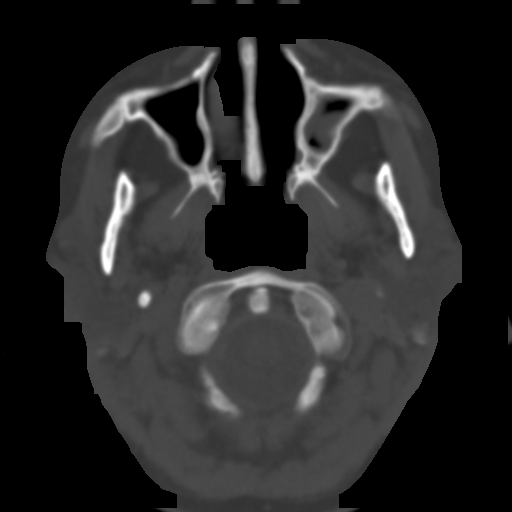
[im 28/55  bone]
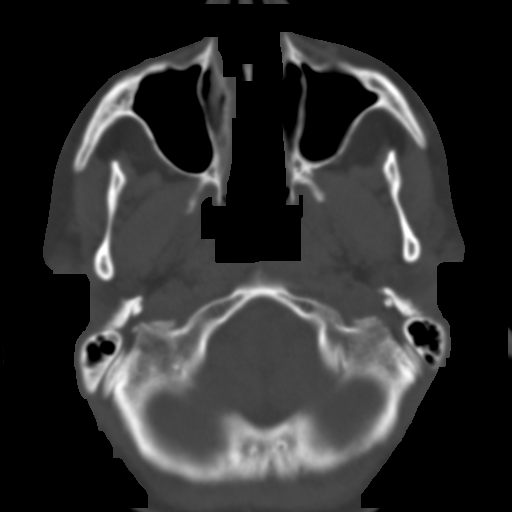
[im 30/55  brain]
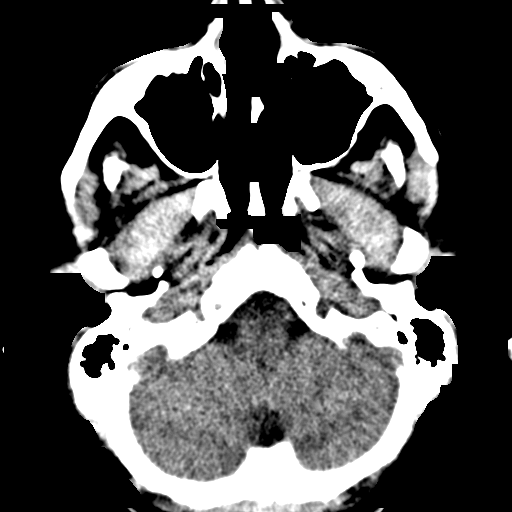
[im 30/55  bone]
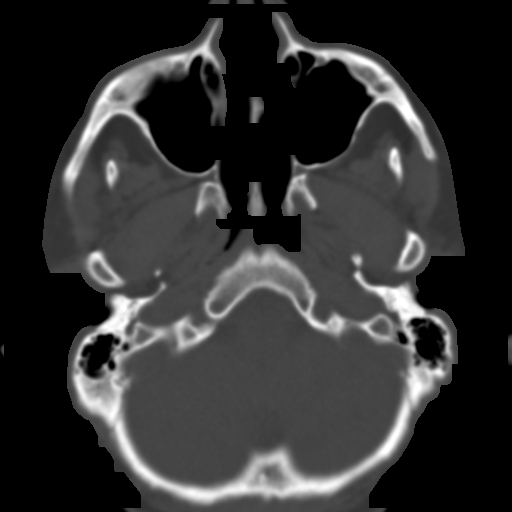
[im 34/55  bone]
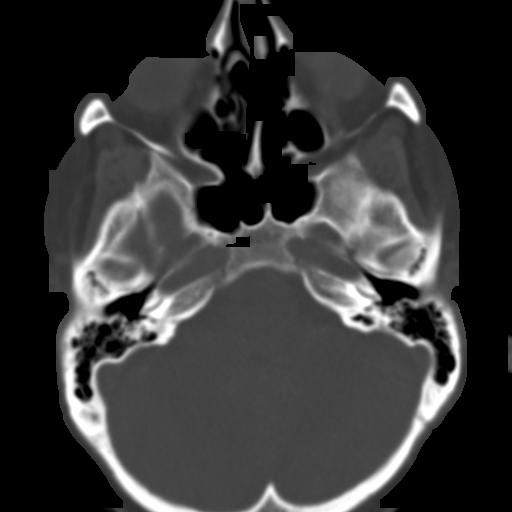
[im 38/55  bone]
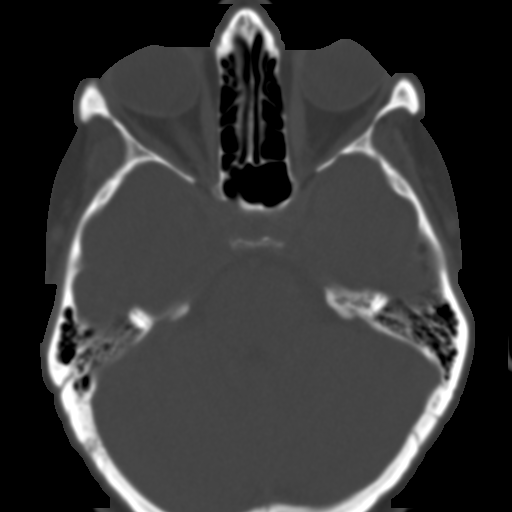
[im 41/55  bone]
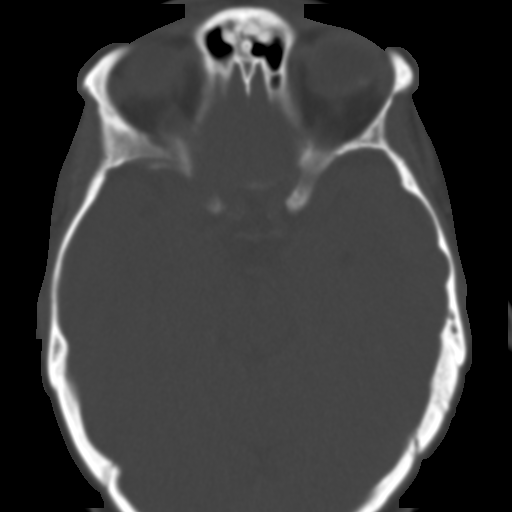
[im 45/55  brain]
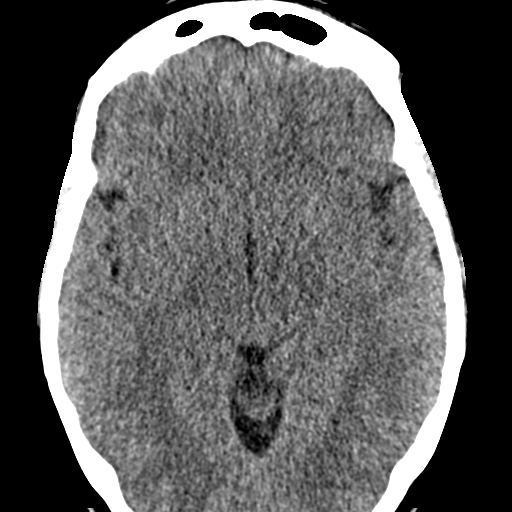
[im 45/55  bone]
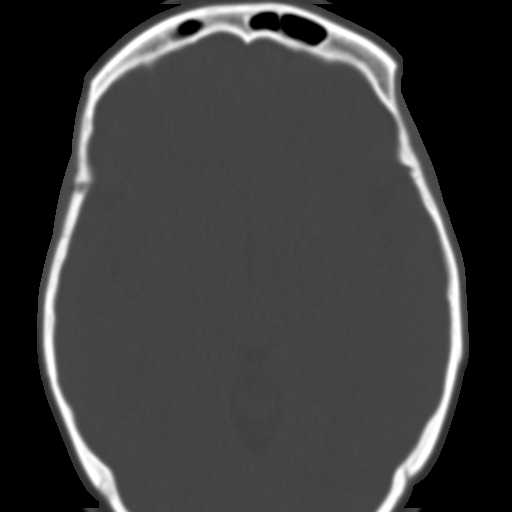
[im 49/55  bone]
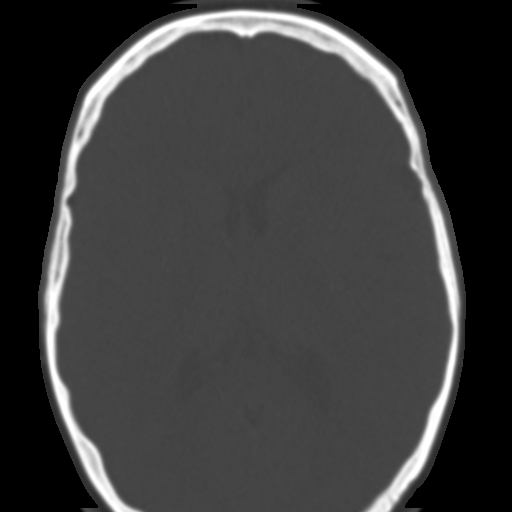
[im 53/55  bone]
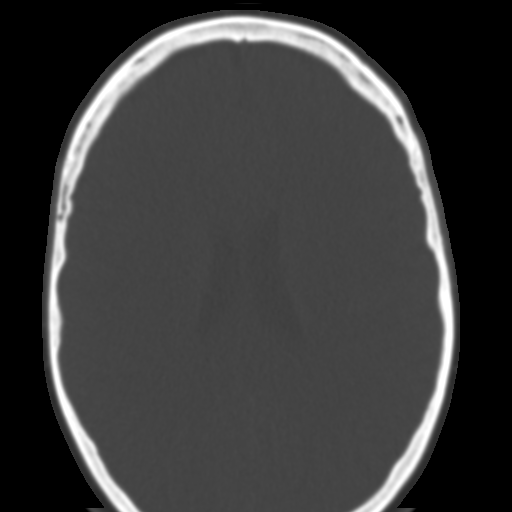

[15 of 30 positions shown; findings below may reference images not displayed]

FINDINGS: Extensive periodontal disease is present. With regard to the RIGHT
mandible, there is a periapical abscess, 9 x 9 mm cross-section,
associated with the roots of the first or second molar. There is
significant loss of mandibular bone, but the cortex is not involved.
It is conceivable this lesion could be confused with a dentigerous
cyst or other mandibular tumor, but given the extensive periodontal
disease and dental caries elsewhere, periapical abscess is strongly
favored.

Significant lucencies surround the maxillary molars on the LEFT.
There is mucosal thickening in the floor of the LEFT maxillary
sinus, potentially odontogenic. No layering fluid. Remainder the
paranasal sinuses are clear.

Negative nasopharynx. Negative middle ear and mastoid cavities.
Negative intracranial compartment.
IMPRESSION: Extensive periodontal disease is present. With regard to the RIGHT
mandible, there is a 9 x 9 mm periapical abscess, favored to
represent the questioned mandibular lesion. See discussion above.

## 2017-01-08 LAB — OB RESULTS CONSOLE GC/CHLAMYDIA
Chlamydia: NEGATIVE
GC PROBE AMP, GENITAL: NEGATIVE

## 2017-01-08 LAB — OB RESULTS CONSOLE RUBELLA ANTIBODY, IGM: Rubella: IMMUNE

## 2017-01-08 LAB — OB RESULTS CONSOLE HIV ANTIBODY (ROUTINE TESTING): HIV: NONREACTIVE

## 2017-01-08 LAB — OB RESULTS CONSOLE ANTIBODY SCREEN: Antibody Screen: NEGATIVE

## 2017-01-08 LAB — OB RESULTS CONSOLE ABO/RH: RH Type: POSITIVE

## 2017-01-08 LAB — OB RESULTS CONSOLE HEPATITIS B SURFACE ANTIGEN: Hepatitis B Surface Ag: NEGATIVE

## 2017-01-08 LAB — OB RESULTS CONSOLE RPR: RPR: NONREACTIVE

## 2017-07-04 LAB — OB RESULTS CONSOLE GBS: STREP GROUP B AG: NEGATIVE

## 2017-07-16 ENCOUNTER — Other Ambulatory Visit: Payer: Self-pay | Admitting: Obstetrics

## 2017-07-17 ENCOUNTER — Telehealth (HOSPITAL_COMMUNITY): Payer: Self-pay | Admitting: *Deleted

## 2017-07-17 ENCOUNTER — Encounter (HOSPITAL_COMMUNITY): Payer: Self-pay | Admitting: *Deleted

## 2017-07-17 NOTE — Telephone Encounter (Signed)
Preadmission screen  

## 2017-07-18 ENCOUNTER — Encounter (HOSPITAL_COMMUNITY): Payer: Self-pay | Admitting: *Deleted

## 2017-07-18 ENCOUNTER — Telehealth (HOSPITAL_COMMUNITY): Payer: Self-pay | Admitting: *Deleted

## 2017-07-18 NOTE — Telephone Encounter (Signed)
Preadmission screen  

## 2017-07-22 ENCOUNTER — Inpatient Hospital Stay (HOSPITAL_COMMUNITY)
Admission: AD | Admit: 2017-07-22 | Discharge: 2017-07-22 | Disposition: A | Discharge status: Home / Self Care | Payer: Medicaid Other | Source: Ambulatory Visit | Attending: Obstetrics and Gynecology | Admitting: Obstetrics and Gynecology

## 2017-07-22 ENCOUNTER — Encounter (HOSPITAL_COMMUNITY): Payer: Self-pay

## 2017-07-22 DIAGNOSIS — O471 False labor at or after 37 completed weeks of gestation: Secondary | ICD-10-CM | POA: Insufficient documentation

## 2017-07-22 DIAGNOSIS — N898 Other specified noninflammatory disorders of vagina: Secondary | ICD-10-CM | POA: Diagnosis present

## 2017-07-22 DIAGNOSIS — Z3A38 38 weeks gestation of pregnancy: Secondary | ICD-10-CM

## 2017-07-22 DIAGNOSIS — O4703 False labor before 37 completed weeks of gestation, third trimester: Secondary | ICD-10-CM | POA: Diagnosis not present

## 2017-07-22 DIAGNOSIS — O26893 Other specified pregnancy related conditions, third trimester: Secondary | ICD-10-CM | POA: Insufficient documentation

## 2017-07-22 DIAGNOSIS — O479 False labor, unspecified: Secondary | ICD-10-CM

## 2017-07-22 DIAGNOSIS — O99333 Smoking (tobacco) complicating pregnancy, third trimester: Secondary | ICD-10-CM | POA: Diagnosis not present

## 2017-07-22 DIAGNOSIS — O99343 Other mental disorders complicating pregnancy, third trimester: Secondary | ICD-10-CM | POA: Insufficient documentation

## 2017-07-22 DIAGNOSIS — F17210 Nicotine dependence, cigarettes, uncomplicated: Secondary | ICD-10-CM | POA: Diagnosis not present

## 2017-07-22 DIAGNOSIS — F419 Anxiety disorder, unspecified: Secondary | ICD-10-CM | POA: Insufficient documentation

## 2017-07-22 DIAGNOSIS — Z79899 Other long term (current) drug therapy: Secondary | ICD-10-CM | POA: Insufficient documentation

## 2017-07-22 DIAGNOSIS — F329 Major depressive disorder, single episode, unspecified: Secondary | ICD-10-CM | POA: Diagnosis not present

## 2017-07-22 DIAGNOSIS — Z3A37 37 weeks gestation of pregnancy: Secondary | ICD-10-CM

## 2017-07-22 DIAGNOSIS — R109 Unspecified abdominal pain: Secondary | ICD-10-CM | POA: Diagnosis present

## 2017-07-22 DIAGNOSIS — Z3689 Encounter for other specified antenatal screening: Secondary | ICD-10-CM

## 2017-07-22 LAB — AMNISURE RUPTURE OF MEMBRANE (ROM) NOT AT ARMC: AMNISURE: NEGATIVE

## 2017-07-22 NOTE — MAU Note (Signed)
Patient present with questionable rupture of membranes.  Had regular contractions on Monday, stopped.

## 2017-07-22 NOTE — Discharge Instructions (Signed)
Vaginal Delivery Vaginal delivery means that you will give birth by pushing your baby out of your birth canal (vagina). A team of health care providers will help you before, during, and after vaginal delivery. Birth experiences are unique for every woman and every pregnancy, and birth experiences vary depending on where you choose to give birth. What should I do to prepare for my baby's birth? Before your baby is born, it is important to talk with your health care provider about:  Your labor and delivery preferences. These may include: ? Medicines that you may be given. ? How you will manage your pain. This might include non-medical pain relief techniques or injectable pain relief such as epidural analgesia. ? How you and your baby will be monitored during labor and delivery. ? Who may be in the labor and delivery room with you. ? Your feelings about surgical delivery of your baby (cesarean delivery, or C-section) if this becomes necessary. ? Your feelings about receiving donated blood through an IV tube (blood transfusion) if this becomes necessary.  Whether you are able: ? To take pictures or videos of the birth. ? To eat during labor and delivery. ? To move around, walk, or change positions during labor and delivery.  What to expect after your baby is born, such as: ? Whether delayed umbilical cord clamping and cutting is offered. ? Who will care for your baby right after birth. ? Medicines or tests that may be recommended for your baby. ? Whether breastfeeding is supported in your hospital or birth center. ? How long you will be in the hospital or birth center.  How any medical conditions you have may affect your baby or your labor and delivery experience.  To prepare for your baby's birth, you should also:  Attend all of your health care visits before delivery (prenatal visits) as recommended by your health care provider. This is important.  Prepare your home for your baby's  arrival. Make sure that you have: ? Diapers. ? Baby clothing. ? Feeding equipment. ? Safe sleeping arrangements for you and your baby.  Install a car seat in your vehicle. Have your car seat checked by a certified car seat installer to make sure that it is installed safely.  Think about who will help you with your new baby at home for at least the first several weeks after delivery.  What can I expect when I arrive at the birth center or hospital? Once you are in labor and have been admitted into the hospital or birth center, your health care provider may:  Review your pregnancy history and any concerns you have.  Insert an IV tube into one of your veins. This is used to give you fluids and medicines.  Check your blood pressure, pulse, temperature, and heart rate (vital signs).  Check whether your bag of water (amniotic sac) has broken (ruptured).  Talk with you about your birth plan and discuss pain control options.  Monitoring Your health care provider may monitor your contractions (uterine monitoring) and your baby's heart rate (fetal monitoring). You may need to be monitored:  Often, but not continuously (intermittently).  All the time or for long periods at a time (continuously). Continuous monitoring may be needed if: ? You are taking certain medicines, such as medicine to relieve pain or make your contractions stronger. ? You have pregnancy or labor complications.  Monitoring may be done by:  Placing a special stethoscope or a handheld monitoring device on your abdomen to   check your baby's heartbeat, and feeling your abdomen for contractions. This method of monitoring does not continuously record your baby's heartbeat or your contractions.  Placing monitors on your abdomen (external monitors) to record your baby's heartbeat and the frequency and length of contractions. You may not have to wear external monitors all the time.  Placing monitors inside of your uterus  (internal monitors) to record your baby's heartbeat and the frequency, length, and strength of your contractions. ? Your health care provider may use internal monitors if he or she needs more information about the strength of your contractions or your baby's heart rate. ? Internal monitors are put in place by passing a thin, flexible wire through your vagina and into your uterus. Depending on the type of monitor, it may remain in your uterus or on your baby's head until birth. ? Your health care provider will discuss the benefits and risks of internal monitoring with you and will ask for your permission before inserting the monitors.  Telemetry. This is a type of continuous monitoring that can be done with external or internal monitors. Instead of having to stay in bed, you are able to move around during telemetry. Ask your health care provider if telemetry is an option for you.  Physical exam Your health care provider may perform a physical exam. This may include:  Checking whether your baby is positioned: ? With the head toward your vagina (head-down). This is most common. ? With the head toward the top of your uterus (head-up or breech). If your baby is in a breech position, your health care provider may try to turn your baby to a head-down position so you can deliver vaginally. If it does not seem that your baby can be born vaginally, your provider may recommend surgery to deliver your baby. In rare cases, you may be able to deliver vaginally if your baby is head-up (breech delivery). ? Lying sideways (transverse). Babies that are lying sideways cannot be delivered vaginally.  Checking your cervix to determine: ? Whether it is thinning out (effacing). ? Whether it is opening up (dilating). ? How low your baby has moved into your birth canal.  What are the three stages of labor and delivery?  Normal labor and delivery is divided into the following three stages: Stage 1  Stage 1 is the  longest stage of labor, and it can last for hours or days. Stage 1 includes: ? Early labor. This is when contractions may be irregular, or regular and mild. Generally, early labor contractions are more than 10 minutes apart. ? Active labor. This is when contractions get longer, more regular, more frequent, and more intense. ? The transition phase. This is when contractions happen very close together, are very intense, and may last longer than during any other part of labor.  Contractions generally feel mild, infrequent, and irregular at first. They get stronger, more frequent (about every 2-3 minutes), and more regular as you progress from early labor through active labor and transition.  Many women progress through stage 1 naturally, but you may need help to continue making progress. If this happens, your health care provider may talk with you about: ? Rupturing your amniotic sac if it has not ruptured yet. ? Giving you medicine to help make your contractions stronger and more frequent.  Stage 1 ends when your cervix is completely dilated to 4 inches (10 cm) and completely effaced. This happens at the end of the transition phase. Stage 2  Once   your cervix is completely effaced and dilated to 4 inches (10 cm), you may start to feel an urge to push. It is common for the body to naturally take a rest before feeling the urge to push, especially if you received an epidural or certain other pain medicines. This rest period may last for up to 1-2 hours, depending on your unique labor experience.  During stage 2, contractions are generally less painful, because pushing helps relieve contraction pain. Instead of contraction pain, you may feel stretching and burning pain, especially when the widest part of your baby's head passes through the vaginal opening (crowning).  Your health care provider will closely monitor your pushing progress and your baby's progress through the vagina during stage 2.  Your  health care provider may massage the area of skin between your vaginal opening and anus (perineum) or apply warm compresses to your perineum. This helps it stretch as the baby's head starts to crown, which can help prevent perineal tearing. ? In some cases, an incision may be made in your perineum (episiotomy) to allow the baby to pass through the vaginal opening. An episiotomy helps to make the opening of the vagina larger to allow more room for the baby to fit through.  It is very important to breathe and focus so your health care provider can control the delivery of your baby's head. Your health care provider may have you decrease the intensity of your pushing, to help prevent perineal tearing.  After delivery of your baby's head, the shoulders and the rest of the body generally deliver very quickly and without difficulty.  Once your baby is delivered, the umbilical cord may be cut right away, or this may be delayed for 1-2 minutes, depending on your baby's health. This may vary among health care providers, hospitals, and birth centers.  If you and your baby are healthy enough, your baby may be placed on your chest or abdomen to help maintain the baby's temperature and to help you bond with each other. Some mothers and babies start breastfeeding at this time. Your health care team will dry your baby and help keep your baby warm during this time.  Your baby may need immediate care if he or she: ? Showed signs of distress during labor. ? Has a medical condition. ? Was born too early (prematurely). ? Had a bowel movement before birth (meconium). ? Shows signs of difficulty transitioning from being inside the uterus to being outside of the uterus. If you are planning to breastfeed, your health care team will help you begin a feeding. Stage 3  The third stage of labor starts immediately after the birth of your baby and ends after you deliver the placenta. The placenta is an organ that develops  during pregnancy to provide oxygen and nutrients to your baby in the womb.  Delivering the placenta may require some pushing, and you may have mild contractions. Breastfeeding can stimulate contractions to help you deliver the placenta.  After the placenta is delivered, your uterus should tighten (contract) and become firm. This helps to stop bleeding in your uterus. To help your uterus contract and to control bleeding, your health care provider may: ? Give you medicine by injection, through an IV tube, by mouth, or through your rectum (rectally). ? Massage your abdomen or perform a vaginal exam to remove any blood clots that are left in your uterus. ? Empty your bladder by placing a thin, flexible tube (catheter) into your bladder. ? Encourage   you to breastfeed your baby. After labor is over, you and your baby will be monitored closely to ensure that you are both healthy until you are ready to go home. Your health care team will teach you how to care for yourself and your baby. This information is not intended to replace advice given to you by your health care provider. Make sure you discuss any questions you have with your health care provider. Document Released: 05/30/2008 Document Revised: 03/10/2016 Document Reviewed: 09/05/2015 Elsevier Interactive Patient Education  2018 [employer] Inc[employer]  
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
## 2017-07-22 NOTE — MAU Note (Signed)
Notified Dr. Kenney HousemanAnderson amnisure negative, contractions 10 minutes apart, 3/60, per MD discharge patient to home. Cannot put in disposition as has no access to Epic at home. Requested MAU provider to do so.

## 2017-07-25 ENCOUNTER — Encounter (HOSPITAL_COMMUNITY): Payer: Self-pay

## 2017-07-25 ENCOUNTER — Inpatient Hospital Stay (HOSPITAL_COMMUNITY)
Admission: RE | Admit: 2017-07-25 | Discharge: 2017-07-26 | DRG: 807 | Disposition: A | Discharge status: Home / Self Care | Payer: Medicaid Other | Source: Ambulatory Visit | Attending: Obstetrics | Admitting: Obstetrics

## 2017-07-25 ENCOUNTER — Encounter (HOSPITAL_COMMUNITY): Payer: Self-pay | Admitting: Anesthesiology

## 2017-07-25 DIAGNOSIS — O9832 Other infections with a predominantly sexual mode of transmission complicating childbirth: Secondary | ICD-10-CM | POA: Diagnosis present

## 2017-07-25 DIAGNOSIS — F17210 Nicotine dependence, cigarettes, uncomplicated: Secondary | ICD-10-CM | POA: Diagnosis present

## 2017-07-25 DIAGNOSIS — A6000 Herpesviral infection of urogenital system, unspecified: Secondary | ICD-10-CM | POA: Diagnosis present

## 2017-07-25 DIAGNOSIS — Z3A39 39 weeks gestation of pregnancy: Secondary | ICD-10-CM | POA: Diagnosis not present

## 2017-07-25 DIAGNOSIS — F419 Anxiety disorder, unspecified: Secondary | ICD-10-CM | POA: Diagnosis present

## 2017-07-25 DIAGNOSIS — F329 Major depressive disorder, single episode, unspecified: Secondary | ICD-10-CM | POA: Diagnosis present

## 2017-07-25 DIAGNOSIS — O99334 Smoking (tobacco) complicating childbirth: Secondary | ICD-10-CM | POA: Diagnosis present

## 2017-07-25 DIAGNOSIS — O99344 Other mental disorders complicating childbirth: Secondary | ICD-10-CM | POA: Diagnosis present

## 2017-07-25 DIAGNOSIS — O09523 Supervision of elderly multigravida, third trimester: Secondary | ICD-10-CM | POA: Diagnosis present

## 2017-07-25 DIAGNOSIS — O26893 Other specified pregnancy related conditions, third trimester: Principal | ICD-10-CM | POA: Diagnosis present

## 2017-07-25 LAB — CBC
HCT: 35.3 % — ABNORMAL LOW (ref 36.0–46.0)
Hemoglobin: 11.7 g/dL — ABNORMAL LOW (ref 12.0–15.0)
MCH: 29.3 pg (ref 26.0–34.0)
MCHC: 33.1 g/dL (ref 30.0–36.0)
MCV: 88.3 fL (ref 78.0–100.0)
PLATELETS: 343 10*3/uL (ref 150–400)
RBC: 4 MIL/uL (ref 3.87–5.11)
RDW: 13.1 % (ref 11.5–15.5)
WBC: 8.8 10*3/uL (ref 4.0–10.5)

## 2017-07-25 LAB — TYPE AND SCREEN
ABO/RH(D): O POS
ANTIBODY SCREEN: NEGATIVE

## 2017-07-25 LAB — RPR: RPR: NONREACTIVE

## 2017-07-25 MED ORDER — WITCH HAZEL-GLYCERIN EX PADS: 1.0000 "application " | MEDICATED_PAD | CUTANEOUS | Status: DC | PRN

## 2017-07-25 MED ORDER — COCONUT OIL OIL: 1.0000 "application " | TOPICAL_OIL | Status: DC | PRN

## 2017-07-25 MED ORDER — ACETAMINOPHEN 325 MG PO TABS: 650.0000 mg | ORAL_TABLET | ORAL | Status: DC | PRN

## 2017-07-25 MED ORDER — ESCITALOPRAM OXALATE 20 MG PO TABS
20.0000 mg | ORAL_TABLET | Freq: Every day | ORAL | Status: DC
  Administered 2017-07-25: 20 mg via ORAL
  Filled 2017-07-25 (×2): qty 1

## 2017-07-25 MED ORDER — OXYTOCIN BOLUS FROM INFUSION
500.0000 mL | Freq: Once | INTRAVENOUS | Status: AC
  Administered 2017-07-25: 500 mL via INTRAVENOUS

## 2017-07-25 MED ORDER — OXYCODONE HCL 5 MG PO TABS: 5.0000 mg | ORAL_TABLET | ORAL | Status: DC | PRN

## 2017-07-25 MED ORDER — PRENATAL MULTIVITAMIN CH
1.0000 | ORAL_TABLET | Freq: Every day | ORAL | Status: DC
  Administered 2017-07-26: 1 via ORAL
  Filled 2017-07-25: qty 1

## 2017-07-25 MED ORDER — ONDANSETRON HCL 4 MG/2ML IJ SOLN
4.0000 mg | Freq: Four times a day (QID) | INTRAMUSCULAR | Status: DC | PRN
  Administered 2017-07-25: 4 mg via INTRAVENOUS
  Filled 2017-07-25: qty 2

## 2017-07-25 MED ORDER — OXYCODONE HCL 5 MG PO TABS: 10.0000 mg | ORAL_TABLET | ORAL | Status: DC | PRN

## 2017-07-25 MED ORDER — TETANUS-DIPHTH-ACELL PERTUSSIS 5-2.5-18.5 LF-MCG/0.5 IM SUSP: 0.5000 mL | Freq: Once | INTRAMUSCULAR | Status: DC

## 2017-07-25 MED ORDER — ONDANSETRON HCL 4 MG/2ML IJ SOLN: 4.0000 mg | INTRAMUSCULAR | Status: DC | PRN

## 2017-07-25 MED ORDER — FENTANYL CITRATE (PF) 100 MCG/2ML IJ SOLN
50.0000 ug | INTRAMUSCULAR | Status: DC | PRN
  Administered 2017-07-25: 50 ug via INTRAVENOUS
  Filled 2017-07-25: qty 2

## 2017-07-25 MED ORDER — OXYCODONE-ACETAMINOPHEN 5-325 MG PO TABS: 2.0000 | ORAL_TABLET | ORAL | Status: DC | PRN

## 2017-07-25 MED ORDER — OXYTOCIN 40 UNITS IN LACTATED RINGERS INFUSION - SIMPLE MED: 2.5000 [IU]/h | INTRAVENOUS | Status: DC

## 2017-07-25 MED ORDER — BENZOCAINE-MENTHOL 20-0.5 % EX AERO: 1.0000 "application " | INHALATION_SPRAY | CUTANEOUS | Status: DC | PRN

## 2017-07-25 MED ORDER — SOD CITRATE-CITRIC ACID 500-334 MG/5ML PO SOLN: 30.0000 mL | ORAL | Status: DC | PRN

## 2017-07-25 MED ORDER — TERBUTALINE SULFATE 1 MG/ML IJ SOLN
0.2500 mg | Freq: Once | INTRAMUSCULAR | Status: DC | PRN
  Filled 2017-07-25: qty 1

## 2017-07-25 MED ORDER — LACTATED RINGERS IV SOLN
INTRAVENOUS | Status: DC
  Administered 2017-07-25: 08:00:00 via INTRAVENOUS

## 2017-07-25 MED ORDER — IBUPROFEN 600 MG PO TABS
600.0000 mg | ORAL_TABLET | Freq: Four times a day (QID) | ORAL | Status: DC
  Administered 2017-07-25 – 2017-07-26 (×5): 600 mg via ORAL
  Filled 2017-07-25 (×5): qty 1

## 2017-07-25 MED ORDER — OXYCODONE-ACETAMINOPHEN 5-325 MG PO TABS: 1.0000 | ORAL_TABLET | ORAL | Status: DC | PRN

## 2017-07-25 MED ORDER — LIDOCAINE HCL (PF) 1 % IJ SOLN
30.0000 mL | INTRAMUSCULAR | Status: DC | PRN
  Administered 2017-07-25: 30 mL via SUBCUTANEOUS
  Filled 2017-07-25: qty 30

## 2017-07-25 MED ORDER — OXYTOCIN 40 UNITS IN LACTATED RINGERS INFUSION - SIMPLE MED
1.0000 m[IU]/min | INTRAVENOUS | Status: DC
  Administered 2017-07-25: 2 m[IU]/min via INTRAVENOUS
  Filled 2017-07-25: qty 1000

## 2017-07-25 MED ORDER — LACTATED RINGERS IV SOLN: 500.0000 mL | INTRAVENOUS | Status: DC | PRN

## 2017-07-25 MED ORDER — DIBUCAINE 1 % RE OINT: 1.0000 "application " | TOPICAL_OINTMENT | RECTAL | Status: DC | PRN

## 2017-07-25 MED ORDER — SIMETHICONE 80 MG PO CHEW: 80.0000 mg | CHEWABLE_TABLET | ORAL | Status: DC | PRN

## 2017-07-25 MED ORDER — SENNOSIDES-DOCUSATE SODIUM 8.6-50 MG PO TABS
2.0000 | ORAL_TABLET | ORAL | Status: DC
  Administered 2017-07-26: 2 via ORAL
  Filled 2017-07-25: qty 2

## 2017-07-25 MED ORDER — DIPHENHYDRAMINE HCL 25 MG PO CAPS: 25.0000 mg | ORAL_CAPSULE | Freq: Four times a day (QID) | ORAL | Status: DC | PRN

## 2017-07-25 MED ORDER — ONDANSETRON HCL 4 MG PO TABS: 4.0000 mg | ORAL_TABLET | ORAL | Status: DC | PRN
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
## 2017-07-25 NOTE — Progress Notes (Signed)
Inc discomfort with contractions  BP 110/73   Pulse 70   Temp (!) 97.3 F (36.3 C) (Oral)   Resp 18   LMP 09/27/2016  SVE: 5/50/-1, AROM clear fluid Toco: q2-3 min EFM: 120s, mod var, Cat I  A&P: g3p2 @[redacted]w[redacted]d  w IOL for AMA Cont pitocin Fsr/vtx/ gbs neg Anticipate SVD

## 2017-07-25 NOTE — Lactation Note (Signed)
This note was copied from a baby's chart. Lactation Consultation Note  Patient Name: Boy Levin BaconJulianne Binegar RUEAV'WToday's Date: 07/25/2017   Initial consult at 4112 hrs old, but infant was getting a hearing screen.  LC dropped off brochure and left room. NT came and told LC that mom requested LC to not come for visit as she plans to do both breast and formula.  GA 39.0; BW 6 lbs, 15.8 oz.  Mom P2 Mom on Lexapro (L2 category according to [employer] Medications and Mothers Milk) and on Valtrex (L2). Infant has breastfed x2 (10-12 min) + attempt x1 (0 min) + formula via bottle x2 (5-15 ml); voids-1; stools-1 since birth 12 hrs ago.    Consult Status Consult Status: Complete    Lendon KaVann, Shammond Arave Walker 07/25/2017, 10:58 PM

## 2017-07-25 NOTE — Anesthesia Pain Management Evaluation Note (Signed)
  CRNA Pain Management Visit Note  Patient: Renie OraJulianne M Towe, 36 y.o., female  "Hello [facility] am a [facility]. We have an anesthesia team available at all times to provide care throughout the hospital, including epidural management and anesthesia for C-section. I don't know your plan for the delivery whether it a natural birth, water birth, IV sedation, nitrous supplementation, doula or epidural, but we want to meet your pain goals."   1.Was your pain managed to your expectations on prior hospitalizations?   Yes   2.What is your expectation for pain management during this hospitalization?     Epidural and IV pain meds  3.How can we help you reach that goal? Be available  Record the patient's initial score and the patient's pain goal.   Pain: 7  Pain Goal: 10 [facility] wants you to be able to say your pain was always managed very well.  Edison PaceWILKERSON,Jahara Dail 07/25/2017
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
## 2017-07-26 ENCOUNTER — Encounter (HOSPITAL_COMMUNITY): Payer: Self-pay | Admitting: *Deleted

## 2017-07-26 LAB — CBC
HEMATOCRIT: 32.1 % — AB (ref 36.0–46.0)
Hemoglobin: 10.6 g/dL — ABNORMAL LOW (ref 12.0–15.0)
MCH: 29.6 pg (ref 26.0–34.0)
MCHC: 33 g/dL (ref 30.0–36.0)
MCV: 89.7 fL (ref 78.0–100.0)
Platelets: 299 10*3/uL (ref 150–400)
RBC: 3.58 MIL/uL — ABNORMAL LOW (ref 3.87–5.11)
RDW: 13.1 % (ref 11.5–15.5)
WBC: 11.8 10*3/uL — ABNORMAL HIGH (ref 4.0–10.5)

## 2017-07-26 MED ORDER — IBUPROFEN 600 MG PO TABS: 600.0000 mg | ORAL_TABLET | Freq: Four times a day (QID) | ORAL | 0 refills | Status: AC

## 2017-07-26 NOTE — Progress Notes (Signed)
PPD#1 Pt doing well. Ready for discharge. Lochia wnl IMP/ doing well Plan/ Discharge.

## 2017-07-26 NOTE — Plan of Care (Signed)
I reviewed potential postpartum complications, including postpartum depression and anxiety and the "Baby Blues". Patient stated that she managed her anxiety and depression by taking Lexapro and by "not thinking about it". She stated that just thinking about it makes her nervous. 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 She states that she will be able to recognize if her level of anxiety increases or if she becomes depressed. She states that she has a Information systems manager"counselor" that she has seen in the past that she can revisit should the need arise. Patient also stated that she has a "great support system".

## 2017-07-26 NOTE — Plan of Care (Signed)
During her morning assessment, patient complained about the soreness and pressure that her hemorrhoids were causing her upon sitting. She refused Dibucaine ointment and TUCKS pads, stating that she would "take care of them herself at home." I reviewed perineal hygiene with the patient.

## 2017-07-26 NOTE — Discharge Summary (Signed)
Obstetric Discharge Summary Reason for Admission: induction of labor Prenatal Procedures: NST and ultrasound Intrapartum Procedures: spontaneous vaginal delivery Postpartum Procedures: none Complications-Operative and Postpartum: 1st degree perineal laceration Hemoglobin  Date Value Ref Range Status  07/26/2017 10.6 (L) 12.0 - 15.0 g/dL Final   HCT  Date Value Ref Range Status  07/26/2017 32.1 (L) 36.0 - 46.0 % Final    Physical Exam:  General: alert and cooperative Lochia: appropriate Uterine Fundus: firm   Discharge Diagnoses: Term Pregnancy-delivered  Discharge Information: Date: 07/26/2017 Activity: pelvic rest Diet: routine Medications: PNV and Ibuprofen Condition: stable Instructions: refer to practice specific booklet Discharge to: home Follow-up Information    Levi AlandAnderson, Deniyah Dillavou E, MD. Schedule an appointment as soon as possible for a visit in 1 month(s).   Specialty:  Obstetrics and Gynecology Contact information: [address] [place] [id number] [phone]           Newborn Data: Live born child  Birth Weight: 6 lb 15.8 oz (3170 g) APGAR: 9, 9  Newborn Delivery   Birth date/time:  07/25/2017 10:51:00 Delivery type:  Vaginal, Spontaneous     Home with mother.  Jane Richardson 07/26/2017, 9:55 AM
